# Patient Record
Sex: Male | Born: 1940 | Race: White | Hispanic: No | Marital: Single
Health system: Southern US, Community
[De-identification: ages and names within clinical notes are randomized; demographics above are authoritative.]

## PROBLEM LIST (undated history)

## (undated) DIAGNOSIS — I25810 Atherosclerosis of coronary artery bypass graft(s) without angina pectoris: Secondary | ICD-10-CM

## (undated) DIAGNOSIS — Z951 Presence of aortocoronary bypass graft: Secondary | ICD-10-CM

## (undated) DIAGNOSIS — I2510 Atherosclerotic heart disease of native coronary artery without angina pectoris: Secondary | ICD-10-CM

## (undated) HISTORY — DX: Presence of aortocoronary bypass graft: Z95.1

## (undated) HISTORY — DX: Atherosclerosis of coronary artery bypass graft(s) without angina pectoris: I25.810

## (undated) HISTORY — DX: Atherosclerotic heart disease of native coronary artery without angina pectoris: I25.10

---

## 1996-11-28 DIAGNOSIS — Z951 Presence of aortocoronary bypass graft: Secondary | ICD-10-CM

## 1996-11-28 HISTORY — PX: CORONARY ARTERY BYPASS GRAFT: SHX141

## 1996-11-28 HISTORY — DX: Presence of aortocoronary bypass graft: Z95.1

## 1999-04-23 ENCOUNTER — Encounter: Payer: Self-pay | Admitting: Emergency Medicine

## 1999-04-23 ENCOUNTER — Inpatient Hospital Stay (HOSPITAL_COMMUNITY): Admission: EM | Admit: 1999-04-23 | Discharge: 1999-04-25 | Payer: Self-pay | Admitting: Emergency Medicine

## 1999-05-10 ENCOUNTER — Ambulatory Visit (HOSPITAL_COMMUNITY): Admission: RE | Admit: 1999-05-10 | Discharge: 1999-05-10 | Payer: Self-pay | Admitting: Cardiology

## 1999-05-11 ENCOUNTER — Encounter: Payer: Self-pay | Admitting: Cardiology

## 1999-06-03 ENCOUNTER — Inpatient Hospital Stay (HOSPITAL_COMMUNITY): Admission: AD | Admit: 1999-06-03 | Discharge: 1999-06-05 | Payer: Self-pay | Admitting: Cardiology

## 1999-09-14 ENCOUNTER — Emergency Department (HOSPITAL_COMMUNITY): Admission: EM | Admit: 1999-09-14 | Discharge: 1999-09-14 | Payer: Self-pay | Admitting: Emergency Medicine

## 2002-09-19 ENCOUNTER — Ambulatory Visit (HOSPITAL_COMMUNITY): Admission: RE | Admit: 2002-09-19 | Discharge: 2002-09-19 | Payer: Self-pay | Admitting: Cardiology

## 2002-09-20 ENCOUNTER — Encounter: Payer: Self-pay | Admitting: Cardiology

## 2004-09-21 ENCOUNTER — Encounter (INDEPENDENT_AMBULATORY_CARE_PROVIDER_SITE_OTHER): Payer: Self-pay | Admitting: *Deleted

## 2004-09-21 ENCOUNTER — Ambulatory Visit (HOSPITAL_COMMUNITY): Admission: RE | Admit: 2004-09-21 | Discharge: 2004-09-21 | Payer: Self-pay | Admitting: Gastroenterology

## 2009-07-06 ENCOUNTER — Encounter: Admission: RE | Admit: 2009-07-06 | Discharge: 2009-07-06 | Payer: Self-pay | Admitting: Cardiology

## 2009-07-10 ENCOUNTER — Inpatient Hospital Stay (HOSPITAL_COMMUNITY): Admission: RE | Admit: 2009-07-10 | Discharge: 2009-07-11 | Payer: Self-pay | Admitting: Cardiology

## 2010-11-28 DIAGNOSIS — I25810 Atherosclerosis of coronary artery bypass graft(s) without angina pectoris: Secondary | ICD-10-CM

## 2010-11-28 HISTORY — DX: Atherosclerosis of coronary artery bypass graft(s) without angina pectoris: I25.810

## 2010-11-28 HISTORY — PX: CARDIAC CATHETERIZATION: SHX172

## 2011-01-18 ENCOUNTER — Ambulatory Visit
Admission: RE | Admit: 2011-01-18 | Discharge: 2011-01-18 | Disposition: A | Discharge status: Home / Self Care | Payer: BC Managed Care – PPO | Source: Ambulatory Visit | Attending: Family Medicine | Admitting: Family Medicine

## 2011-01-18 ENCOUNTER — Other Ambulatory Visit: Payer: Self-pay | Admitting: Family Medicine

## 2011-01-18 DIAGNOSIS — R079 Chest pain, unspecified: Secondary | ICD-10-CM

## 2011-01-18 DIAGNOSIS — R0781 Pleurodynia: Secondary | ICD-10-CM

## 2011-01-18 MED ORDER — IOHEXOL 300 MG/ML  SOLN
75.0000 mL | Freq: Once | INTRAMUSCULAR | Status: AC | PRN
  Administered 2011-01-18: 75 mL via INTRAVENOUS

## 2011-02-25 ENCOUNTER — Ambulatory Visit (HOSPITAL_COMMUNITY)
Admission: RE | Admit: 2011-02-25 | Discharge: 2011-02-25 | Disposition: A | Discharge status: Home / Self Care | Payer: BC Managed Care – PPO | Source: Ambulatory Visit | Attending: Cardiology | Admitting: Cardiology

## 2011-02-25 DIAGNOSIS — R9439 Abnormal result of other cardiovascular function study: Secondary | ICD-10-CM | POA: Insufficient documentation

## 2011-02-25 DIAGNOSIS — I2510 Atherosclerotic heart disease of native coronary artery without angina pectoris: Secondary | ICD-10-CM | POA: Insufficient documentation

## 2011-02-25 DIAGNOSIS — Z9861 Coronary angioplasty status: Secondary | ICD-10-CM | POA: Insufficient documentation

## 2011-02-26 NOTE — Procedures (Signed)
Jeffery Carey, Jeffery Carey NO.:  [id number]  MEDICAL RECORD NO.:  [id number]           PATIENT TYPE:  O  LOCATION:  [facility]                         FACILITY:  [facility]  PHYSICIAN:  Landry Corporal, MD DATE OF BIRTH:  25-Jun-1941  DATE OF PROCEDURE:  02/25/2011 DATE OF DISCHARGE:  02/25/2011                           CARDIAC CATHETERIZATION   PERFORMING PHYSICIAN:  Landry Corporal, MD  PRIMARY CARDIOLOGIST:  Thereasa Solo. Little, MD  PRIMARY CARE PHYSICIAN:  Anna Genre. Little, MD  PROCEDURE PERFORMED: 1. Left heart catheterization via 5-French right femoral artery     access. 2. Left ventriculogram in the right anterior oblique projection, 10 mL     contrast for 4 seconds. 3. Native coronary artery angiography. 4. Left internal mammary artery angiography. 5. Saphenous vein graft angiography x3.  INDICATION:  Abnormal stress test with known coronary disease.  Mr. Eisenhardt is a very pleasant 70 year old gentleman with history of known coronary disease status post PCI to the right coronary artery, followed by CABG x4 in 1998.  He then subsequently underwent PCI to the SVG to RCA on two separate occasions, most recently being in 2010.  He was seen by Dr. Clarene Duke at that time and he did not have any symptoms in the setting of having a severe stenosis in the vein graft and therefore, for surveillance given his risk factors, the patient was sent for nuclear myocardial perfusion study which showed evidence of new area of ischemia in the inferobasal region.  Just based on the prior disease noted with an abnormal stress test, the patient was referred for diagnostic catheterization.  The patient has not necessarily noted any anginal symptoms but did not note at the time of his previous angioplasty either despite severe disease.  The risks, benefits, alternatives, and indications of the procedure were explained to the patient in detail.  The patient voiced understanding and  agreed to proceed.  Informed consent was obtained with a signed form placed on the chart.  PROCEDURE:  The patient was brought to the Second [facility] in the fasting state.  He was prepped and draped in usual sterile fashion with right femoral access site open. After time-out period was performed, the patient was sedated with intravenous Versed.  The right femoral head was then localized using tactile and fluoroscopic guidance and the right groin was anesthetized using 1% subcutaneous lidocaine.  Right common femoral artery was then accessed using the modified Seldinger technique and a 5-French sheath was placed without difficulty.  After the sheath was aspirated and flushed, first a 5-French JL-4 followed by 5-French JR-4 catheter were advanced over wire.  Multiple angiographic views of first left coronary artery system and followed by the right coronary artery native as well as the three vein grafts and the left internal mammary artery were performed.  The order was actually vein graft to the RCA followed by native RCA, followed by the two vein grafts to the diagonal and ramus intermedius.  The catheter was then pulled back around the arch into the left subclavian artery.  With the catheter advanced  into the subclavian artery, it was then aspirated and flushed and selective angiography of the left internal mammary was performed.  The catheter was then pulled back into the descending aorta, exchanged over wire for a 5-French pigtail catheter which was advanced across the aortic valve measuring left ventricular hemodynamics.  A left ventriculogram was then performed in the RAO projection.  After completion, left ventricular pressures were then remeasured and the catheter was pulled back across the aortic valve measuring pullback gradient.  After completion of this procedure, the catheter was removed completely out of the body over wire  without complications.  The patient was stable before, during, and after the procedure and there were no complications.  Estimated blood loss was 10 mL.  CATHETERIZATION STATISTICS: 1. Sedation.  1 mg IV Versed. 2. Contrast.  Total 125 mL.  HEMODYNAMIC RESULTS: 1. Central aortic pressure 101/65 mmHg with a mean of 81 mmHg. 2. Left ventricular pressure 103/8 mmHg with an EDP of 40 mmHg. 3. The patient remained electrocardiographically and hemodynamically     stable during the entire procedure.  He did receive 250 mL bolus as     his initial opening central venous and central arterial pressures     were noted to be in the low 90s, but the patient was asymptomatic     throughout and is known to have pressures at this level.  ANGIOGRAPHIC FINDINGS: 1. The left main coronary artery is a large ectatic vessel that does     have some distal terminal 30% tubular stenoses in at least 4-mm     vessel. 2. Circumflex is a small to moderate caliber vessel and has a couple     of small obtuse marginals, one of which was 100% occluded.  As     known previously, it then courses into the atrioventricular groove     and it gives some left-to-right collaterals to a right     posterolateral branch.  There is no significant obstructive disease     in the obtuse marginal system. 3. After the diagonal/ramus intermedius is a moderate-sized vessel     that has some ostial/proximal 50-60% narrowing that was thought to     be relatively similar to the prior angiography.  At the downstream,     the vessel bifurcates and then one of the branches actually gives     off another branch and the rest of the vessel had no real     significant coronary artery disease.  There was no evidence of any     retrograde filling from a vein graft at this time. 4. The LAD.  The proximal one half of the vessel was very dilated and     ectatic up to maybe 5 mm in diameter.  It was noted at Dr. Fredirick Maudlin     catheterization.  The  distal half then tapers down and normal size.     Again, there is a small diagonal branch still present.  There is a     small nub of a graft visible in one image that appears to now be     occluded.  The diagonal branch itself is a bifurcating vessel     almost the size of the LAD but then tapers off.  It also gives some     collaterals to the left posterolateral branch.  The LAD does not     reach down to the apex. 5. Right coronary artery is a very large vessel, again may  be 5 mm in     diameter.  The entire midsegment has multiple areas of 90-99%     stenoses in a previously placed stent.  It has bridging collaterals     there as well.  The rest part of the distal RCA is diffusely     diseased going to the posterolateral vessel.  There was again noted     collateral filling of left posterolateral branch from the diagonal     artery and the left circumflex artery.  GRAFTS: 1. The left internal mammary artery to the LAD was very small ectatic     and no longer reaches to the LAD. 2. Saphenous vein graft to the right coronary artery has two widely     patent stents with may be 20% stent in-stent restenosis in the more     proximal portion of the recently placed proximal stent which was a     3.5 x 28 bare-metal stent up sized to 3.75 mm.  There are mild     luminal irregularities through the rest of this graft with     excellent TIMI-3 flow down to the posterior descending artery which     does reach down to the apex. 3. Saphenous vein graft to the ramus intermedius that was patent yet     had subtotal occlusion with 90+ percent stenoses in the previous     catheterization is now 100% flush occluded in the aorto-ostial     location as is the known occlusion of the saphenous vein graft to     the first diagonal branch. 4. Comparative analysis of the prior catheterization to this one     demonstrated that the lesion in the ramus intermedius is roughly     the same as it was during the  last catheterization and with the     main difference being the vein graft to the ramus intermedius is     now 100% occluded. 5. The left ventriculography showed ejection fraction of at least 60%     with no significant wall motion abnormalities.  IMPRESSION: 1. One out of three vein grafts patent, the other two are 100%     occluded in the aorto-ostial location.  The widely patent     percutaneous coronary intervention sites in the saphenous vein     graft to the right coronary artery. 2. Moderate but nonobstructive disease to the ramus intermedius, but     no significant changes from previous. 3. Ectatic proximal left anterior descending territory tapering to     small distal left anterior descending that does not reach the apex.     Left-to-right collaterals to the right posterior descending artery     with severe native right coronary artery disease.  RECOMMENDATIONS: 1. Continue optimized medical management at this time with no plans to     intervene on the ramus intermedius lesion.  If the patient did have     symptoms, this will potentially be the culprit to watch for in     future procedures. 2. The patient will likely be discharged home today and then follow up     with Dr. Clarene Duke.  If he has time, we will get smoking cessation     counseling. 3. I will add aspirin to his home regimen.          ______________________________ Landry Corporal, MD     DWH/MEDQ  D:  02/25/2011  T:  02/26/2011  Job:  [id number]  cc:   Thereasa Solo. Little, M.D. Anna Genre Little, M.D. [facility]  Electronically Signed by Bryan Lemma MD on 02/26/2011 07:37:49 AM

## 2011-03-06 LAB — CBC
MCHC: 34.7 g/dL (ref 30.0–36.0)
Platelets: 201 10*3/uL (ref 150–400)
RBC: 4.51 MIL/uL (ref 4.22–5.81)
RDW: 14 % (ref 11.5–15.5)

## 2011-03-06 LAB — BASIC METABOLIC PANEL
CO2: 28 mEq/L (ref 19–32)
Calcium: 9.1 mg/dL (ref 8.4–10.5)
GFR calc Af Amer: >60 mL/min (ref >60)
GFR calc non Af Amer: >60 mL/min (ref >60)
Sodium: 138 mEq/L (ref 135–145)

## 2011-03-06 LAB — CARDIAC PANEL(CRET KIN+CKTOT+MB+TROPI)
CK, MB: 8.9 ng/mL — ABNORMAL HIGH (ref 0.3–4.0)
Relative Index: 5.1 — ABNORMAL HIGH (ref 0.0–2.5)
Total CK: 181 U/L (ref 7–232)
Total CK: 195 U/L (ref 7–232)
Troponin I: 0.83 ng/mL (ref 0.00–0.06)

## 2011-04-12 NOTE — Cardiovascular Report (Signed)
NAMEBRAYN, Carey                  ACCOUNT NO.:  [id number]   MEDICAL RECORD NO.:  [id number]          PATIENT TYPE:  INP   LOCATION:  2504                         FACILITY:  [facility]   PHYSICIAN:  Thereasa Solo. Little, M.D. DATE OF BIRTH:  14-Nov-1941   DATE OF PROCEDURE:  07/10/2009  DATE OF DISCHARGE:                            CARDIAC CATHETERIZATION   INDICATIONS FOR TEST:  This 70 year old male had bypass surgery in 1998.  He subsequently had a stent placed in the saphenous vein graft to the  RCA in 2002.  He is asymptomatic but had a screening Cardiolite study  that was positive for inferior apical ischemia.  Because of this, he is  brought to the cath lab for outpatient cardiac catheterization.   After obtaining informed consent, the patient was prepped and draped in  the usual sterile fashion exposing the right groin.  Following local  anesthetic with 1% Xylocaine, the Seldinger technique was employed and a  5-French introducer sheath was placed in the right femoral artery on the  first stick.  Left and right coronary arteriography, graft  visualization, ventriculography, and a distal aortogram were performed.   An internal mammary artery catheter was used to cannulate the IMA and a  right coronary catheter cannulated the other grafts.  There was hang up  of the J-wire at the bifurcation and as a result of this, a distal  aortogram was performed.   RESULTS:  1. Hemodynamic monitoring:  Central aortic pressure was 120/78.  Left      ventricular pressure was 125/7.  There was no significant gradient      noted at the time of pullback.  2. Ventriculography:  Ventriculography was performed in the RAO      projection at the end of the diagnostic procedure.  The systolic      function was normal.  The end-diastolic pressure was 17.  No mitral      regurgitation was seen.  The ejection fraction was 55%.  3. 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  Doppler      studies will need to be obtained to evaluate the size of this.  It      did not extend into the iliacs.  4. Coronary arteriography:      a.     Left main:  There was terminal 30% left main disease.  The       left main was about 3.5 mm in diameter.      b.     Circumflex:  The circumflex was a small vessel with very       small OMs with no high-grade stenosis in this system.      c.     Optional diagonal:  There was ostial 50-60% narrowing of       this vessel.  The vessel actually bifurcated and was free of       disease in  other areas.  There was some retrograde filling of the       saphenous vein graft to this system.      d.     LAD:  The proximal half of the LAD was dilated and ectatic       and about 5 mm in diameter.  The distal half was of normal size       and gave rise to a small diagonal.  This segment was free of       disease.      e.     Right coronary artery:  The right coronary artery was a huge       vessel.  It was at least 5 mm in diameter.  The entire mid segment       which was about 25 mm in length had areas of 90% plus narrowing       with remainder of the mid and distal RCA diffusely diseased.       There was collateral filling of the PDA from collaterals from the       left.   Grafts:  1. Saphenous vein graft to the RCA.  In the midportion of the graft      just above the previously placed stent was an area of sequential      80% narrowing with an aneurysmal segment between the two.  The      ongoing graft had mild irregularities and the PDA was widely      patent.  2. Saphenous vein graft to the optional diagonal.  This graft was      subtotaled proximally with faint visualization of the optional      diagonal system secondary to competitive flow from the native      circulation.  3. Saphenous vein graft to the diagonal  100% occluded.  4. Internal mammary artery to the LAD basically ectatic and      nonfunctioning.   Because of the high-grade stenosis in the vein graft to the right and  corresponding ischemia on the nuclear study, arrangements were made for  intervention.  This ended up being a complex PCI.  A 6-French introducer  sheath was exchanged for the 5-French and a JR-4 guide catheter was  used.  A short Luge wire was easily placed down the graft into the  distal RCA.  Attempts at primary stenting were unsuccessful.  The stent  would not pass through the area of obstruction.   Predilatation was accomplished with a 2.5 x 10 Voyager.  A single  inflation of 12 atmospheres for 39 seconds resulted in improvement in  the area of narrowing, but the stent still would not pass.   Predilatation was then performed with the 2.5 x 10 cutting balloon.  Two  Inflations 10 x 45 and 12 x 40 were performed, still the stent would not  pass.   A buddy wire technique was used with a BMW wire.  With this in place,  the stent was finally able to be managed into position that it well  overlapped the original stent and extended proximally well past the area  of obstruction.  Stent deployment of the 3.5 x 28-mm Vision stent was 11  atmospheres for 47 seconds with a final inflation of 12 atmospheres for  44 seconds.   There was no flow phenomenon with this.  His ST segments had been  intermittently elevated, and he required intracoronary nitroglycerin.  After the stent was placed,  there was diminished flow down the distal  vessel and even through the stent.  I postdilated this area with a 3.75  x 20-mm Voyager balloon with 2 inflations.  Following this, he received  additional intracoronary nitroglycerin and 200 mcg of IV verapamil.  With this, there was return of brisk distal flow.  The chest pain  resolved.  The ST-segment that were transiently elevated return to  baseline.   The area that had been 80%  narrowed pre-intervention, now appeared to be  normal post-intervention.  There was good brisk distal flow.   I started him on IV Integrilin for 12 hours just to make sure that any  distal debris had an opportunity to resolve should it be thrombus.   I anticipate his CK and troponins to be elevated.  He will probably need  to be watched on Saturday rather going home as originally planned.   There is still an area of concern that affects the optional diagonal  system.  The graft is such so diffusely diseased that I do not think I  can safely intervene on this.  In addition to that, he has excellent  bidirectional flow and the only concern would be the ostium of the  optional diagonal, it was only 50-60% narrowed and the only safe  treatment  would be a cutting balloon, but at this point with the patient  asymptomatic and the nuclear study not showing any evidence of ischemia  in the distribution of this, I have opted to continue to treat him  medically.   I used a total of 280 mL of contrast and as a result of this, I will  hydrate him aggressively over the next 12 hours.           ______________________________  Thereasa Solo Little, M.D.     ABL/MEDQ  D:  07/10/2009  T:  07/10/2009  Job:  [id number]   cc:   Catheterization Lab  Vikki Ports, M.D.

## 2011-04-12 NOTE — Discharge Summary (Signed)
Jeffery Carey, Jeffery Carey                  ACCOUNT NO.:  [id number]   MEDICAL RECORD NO.:  [id number]          PATIENT TYPE:  INP   LOCATION:  2504                         FACILITY:  [facility]   PHYSICIAN:  Thereasa Solo. Little, M.D. DATE OF BIRTH:  Apr 24, 1941   DATE OF ADMISSION:  07/10/2009  DATE OF DISCHARGE:  07/11/2009                               DISCHARGE SUMMARY   DISCHARGE DIAGNOSES:  1. Coronary artery disease with bypass grafting in 1998, and history      of stent to saphenous vein graft to the right coronary artery in      2000, now with new abnormal nuclear stress test.  2. New finding of stenosis in the saphenous vein graft to the right      coronary artery just proximal to the stent from 2002, undergoing      percutaneous transluminal coronary angioplasty and stent deployment      with a Multilink Vision stent.  There was a complex procedure as      well.  The patient has 100% stenosis of saphenous vein graft to the      diagonal.  3. Obstructive sleep apnea with continuous positive airway pressure.  4. Situational depression with recent loss of his wife due to lung      cancer.  5. Positive tobacco history.  6. Hyperlipidemia.  7. Hypertension.   DISCHARGE CONDITION:  Improved.   PROCEDURES:  Combined left heart catheterization, July 10, 2009, by  Dr. Clarene Duke.   On July 10, 2009, percutaneous transluminal coronary angioplasty and  stent deployment with a Multilink Vision stent to the saphenous vein  graft to the right coronary artery just proximal to the stent placement  in 2002.   DISCHARGE MEDICATIONS:  1. Niaspan 500 mg 3 daily.  2. Potassium 20 mEq daily.  3. Aspirin 325 daily.  4. Lasix 40 mg daily.  5. Vytorin 10/30 daily.  6. Lexapro 20 mg daily.  7. Plavix 75 mg 1 daily, do not stop.  8. Pepcid 20 mg over-the-counter daily to protect his stomach.  9. Toprol-XL 25 mg daily.  10.Lisinopril 5 mg take half a tablet daily.  11.Nitroglycerin 1/150 under your  tongue as needed while sitting one      every 5 minutes up to 3 over 15 minutes, if pain continues, call      [phone].   Please note, these were called into the [facility] pharmacy at [facility]  at the patient's request.   DISCHARGE INSTRUCTIONS:  1. Increase activity slowly.  May shower.  No lifting for 1 week.  No      driving for 2 days.  No sexual activity for 2 days.  2. Low-sodium, heart-healthy diet.  3. Continue CPAP at bedtime.  4. Wash cath site with soap and water.  Call if any bleeding,      swelling, or drainage.  5. Stop smoking.  6. Follow up with Dr. Clarene Duke in 2 weeks.  The office will call with      date and time.   HOSPITAL COURSE:  The patient was brought in  for cardiac catheterization  after a positive nuclear study revealing inferior apical ischemia, which  was new from previous study.  EF on the nuclear study was 51%.  He has  history of coronary artery disease with bypass grafting and a stent  placed to the saphenous vein graft to the RCA in 2002 or 2000.  He also  has obstructive sleep apnea, hyperlipidemia, and hypertension.  He has  had a lot of depression and on Lexapro secondary to his wife's recent  death.  He has saphenous vein graft stenosis proximal to the previous  stents, underwent PTCA and stent deployment by Dr. Clarene Duke.  The patient  tolerated procedure well.  His enzymes initially elevated, but trended  down pretty quickly.  By the next morning, Dr. Mariah Milling felt he was stable  and ready for discharge home.   LABORATORY VALUES:  Initial postprocedure CK-MB was 195; MB at 10;  troponin 1.09, second one was 181 with MB of 8.9 and troponin 0.83.   Sodium 138, potassium 4.2, BUN 11, creatinine 0.85, and glucose 107.   Hemoglobin 15.4, hematocrit 44.3, platelets 201, and WBC 7.4.   PHYSICAL EXAMINATION AT DISCHARGE:  VITAL SIGNS:  Blood pressure 131/81,  pulse 74, respiratory rate 18, temp 97.4, and oxygen saturation on room  air 94%.  HEART:   Regular rate and rhythm.  LUNGS:  Clear to auscultation bilaterally.  ABDOMEN:  Soft and nontender.  EXTREMITIES:  No lower extremity edema.  SKIN:  Warm and dry.  NEUROLOGIC:  Alert and oriented.   The patient was discharged home after ambulating in the hallway without  complications with cardiac rehab who also reviewed on tobacco cessation  as well as proper diet.   FOLLOWUP:  The patient will follow up with Dr. Clarene Duke.      Jeffery Carey, N.P.    ______________________________  Thereasa Solo Little, M.D.    LRI/MEDQ  D:  07/11/2009  T:  07/11/2009  Job:  [id number]   cc:   Thereasa Solo. Little, M.D.  Vikki Ports, M.D.

## 2011-06-03 ENCOUNTER — Other Ambulatory Visit: Payer: Self-pay | Admitting: Family Medicine

## 2011-06-03 DIAGNOSIS — R911 Solitary pulmonary nodule: Secondary | ICD-10-CM

## 2011-07-25 ENCOUNTER — Ambulatory Visit
Admission: RE | Admit: 2011-07-25 | Discharge: 2011-07-25 | Disposition: A | Discharge status: Home / Self Care | Payer: Medicare Other | Source: Ambulatory Visit | Attending: Family Medicine | Admitting: Family Medicine

## 2011-07-25 DIAGNOSIS — R911 Solitary pulmonary nodule: Secondary | ICD-10-CM

## 2011-07-25 MED ORDER — IOHEXOL 300 MG/ML  SOLN
100.0000 mL | Freq: Once | INTRAMUSCULAR | Status: AC | PRN
  Administered 2011-07-25: 100 mL via INTRAVENOUS

## 2011-08-02 ENCOUNTER — Other Ambulatory Visit: Payer: Self-pay | Admitting: Dermatology

## 2012-02-02 ENCOUNTER — Other Ambulatory Visit: Payer: Self-pay | Admitting: Family Medicine

## 2012-02-02 DIAGNOSIS — R918 Other nonspecific abnormal finding of lung field: Secondary | ICD-10-CM

## 2012-02-03 ENCOUNTER — Other Ambulatory Visit: Payer: Self-pay | Admitting: Otolaryngology

## 2012-02-23 ENCOUNTER — Ambulatory Visit
Admission: RE | Admit: 2012-02-23 | Discharge: 2012-02-23 | Disposition: A | Discharge status: Home / Self Care | Payer: Medicare Other | Source: Ambulatory Visit | Attending: Family Medicine | Admitting: Family Medicine

## 2012-02-23 DIAGNOSIS — R918 Other nonspecific abnormal finding of lung field: Secondary | ICD-10-CM

## 2013-03-05 IMAGING — CT CT CHEST W/O CM
2 of 4 series · 15 of 36 positions shown, 18 images · non-contrast
Comparison: 07/25/2011

CLINICAL DATA: Follow up of pulmonary nodules.  Diagnosed
07/19/2011.  Occasional cigar smoker.  No history of primary
malignancy.

CT CHEST WITHOUT CONTRAST
TECHNIQUE: Multidetector CT imaging of the chest was performed
following the standard protocol without IV contrast.

[Series 2: chest w/o · axial · non-contrast · 0.76mm/px · z∈[-305,-30]mm · 12 of 66 slices shown, 15 images]
[im 6/66  mediastinal]
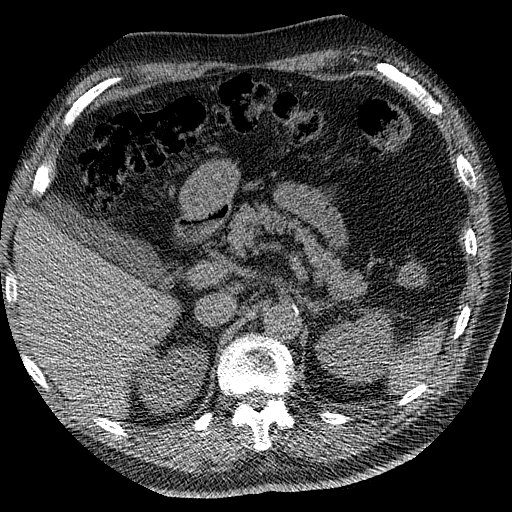
[im 6/66  lung]
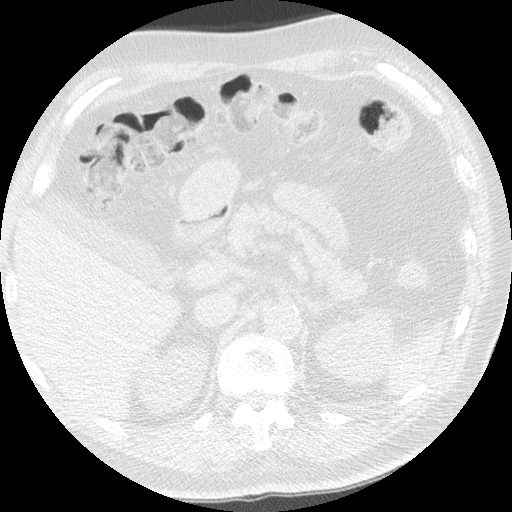
[im 11/66  lung]
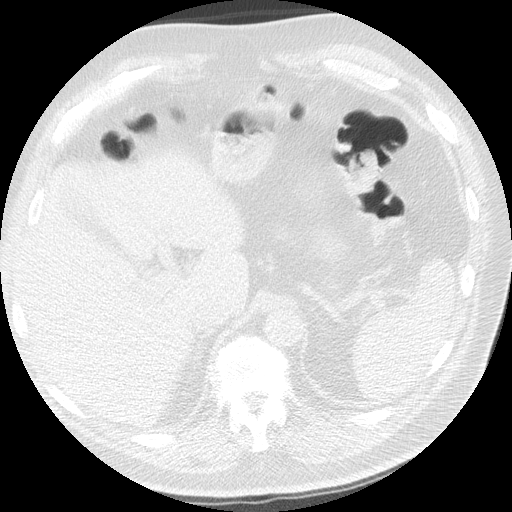
[im 16/66  lung]
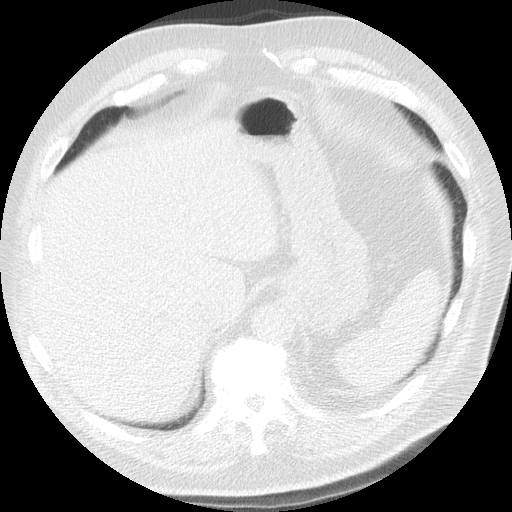
[im 21/66  lung]
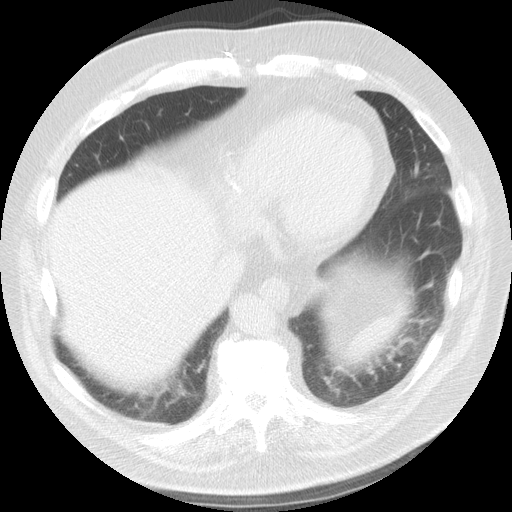
[im 26/66  mediastinal]
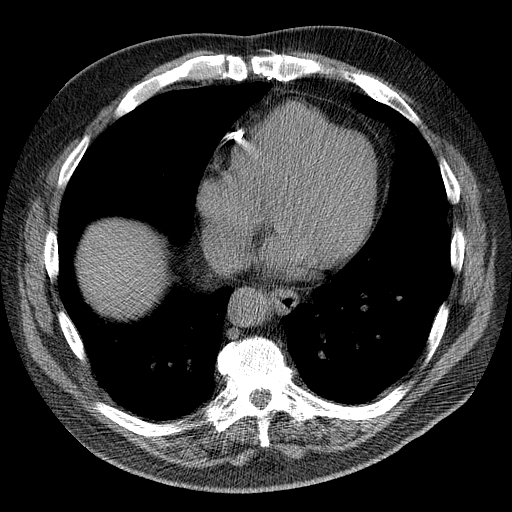
[im 26/66  lung]
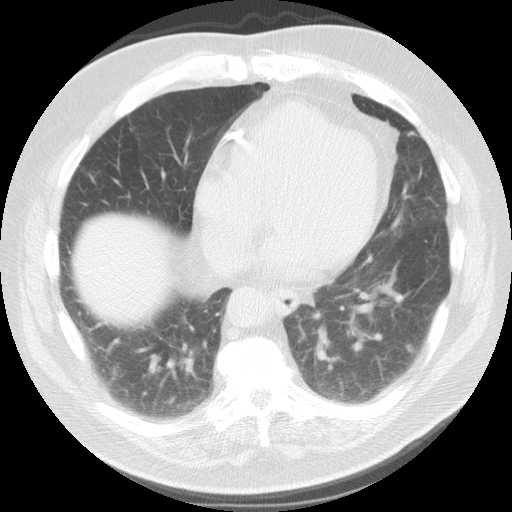
[im 31/66  lung]
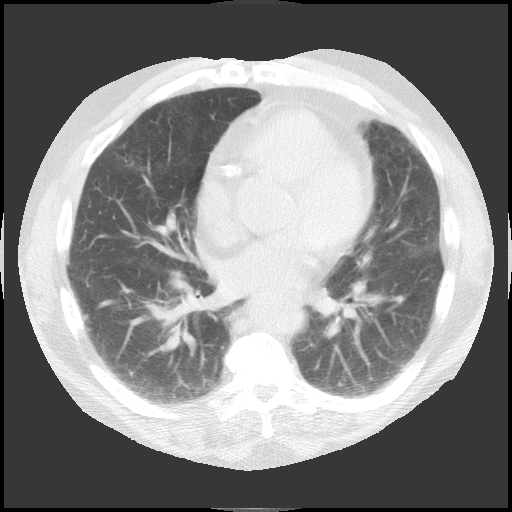
[im 36/66  lung]
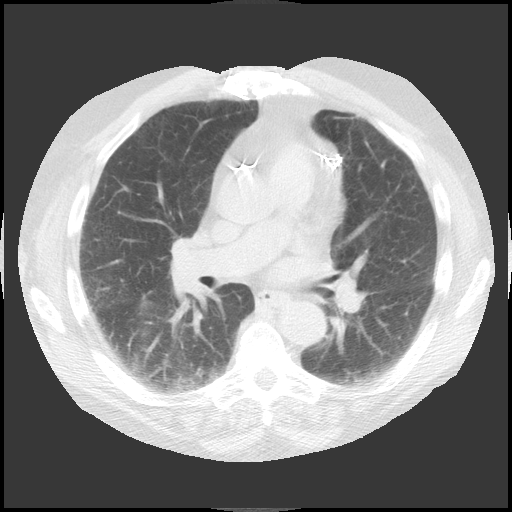
[im 41/66  lung]
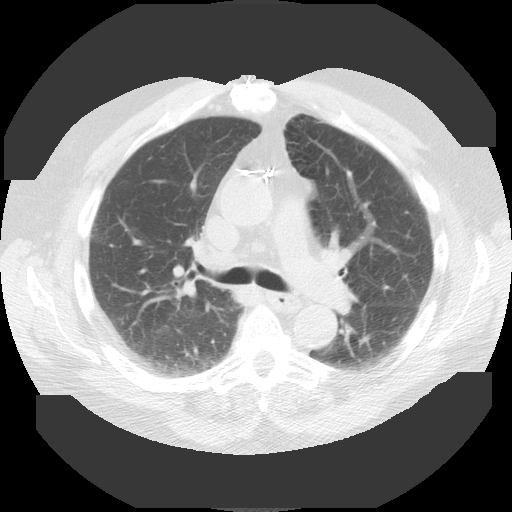
[im 46/66  mediastinal]
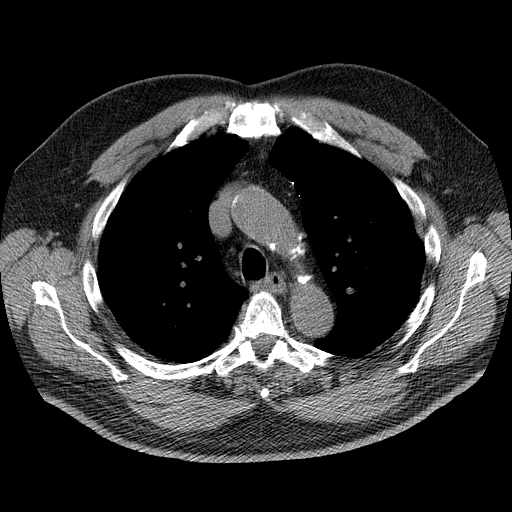
[im 46/66  lung]
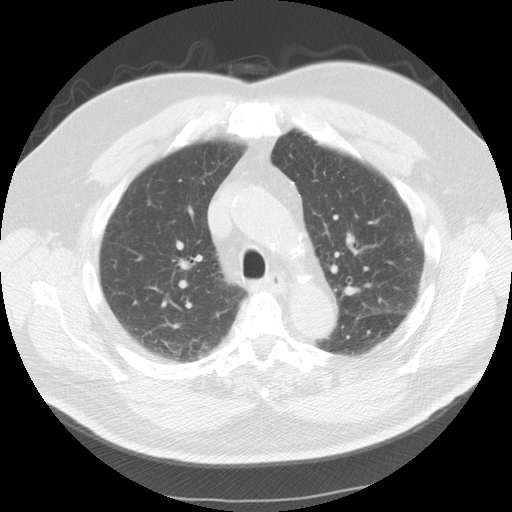
[im 51/66  lung]
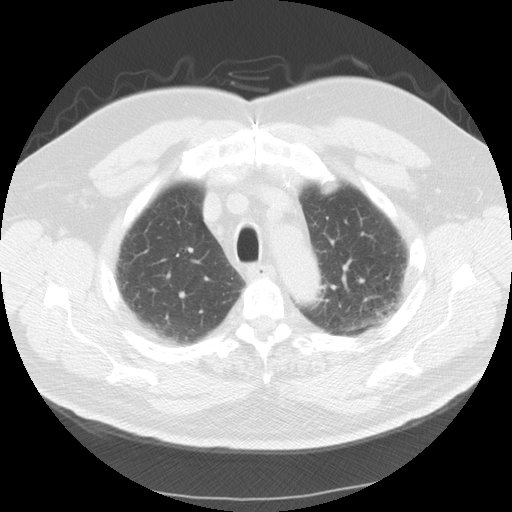
[im 56/66  lung]
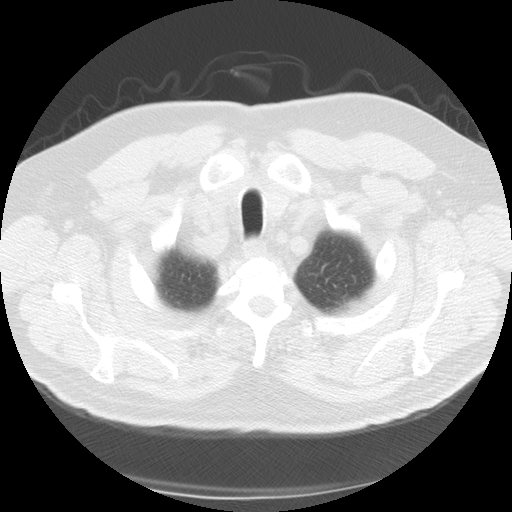
[im 61/66  lung]
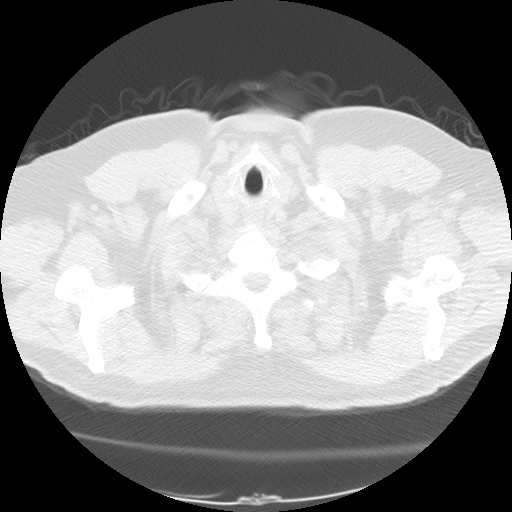

[Series 103: cor · coronal · 0.76mm/px · 3 of 141 slices shown]
[im 29/141  lung]
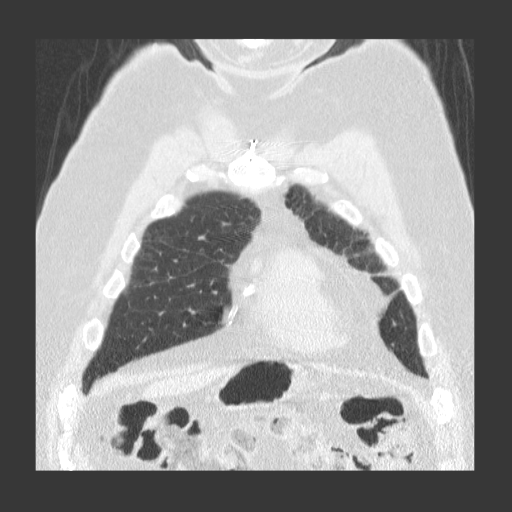
[im 57/141  lung]
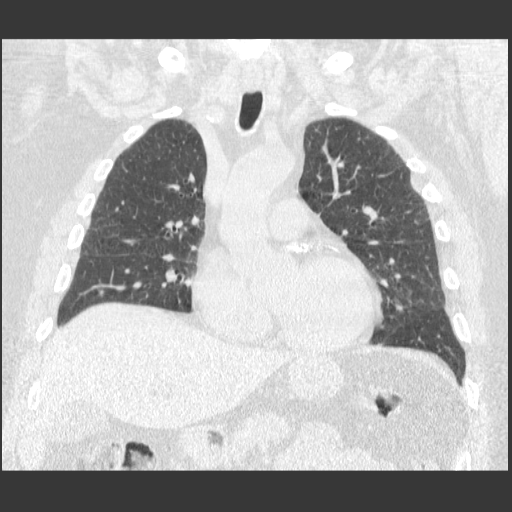
[im 85/141  lung]
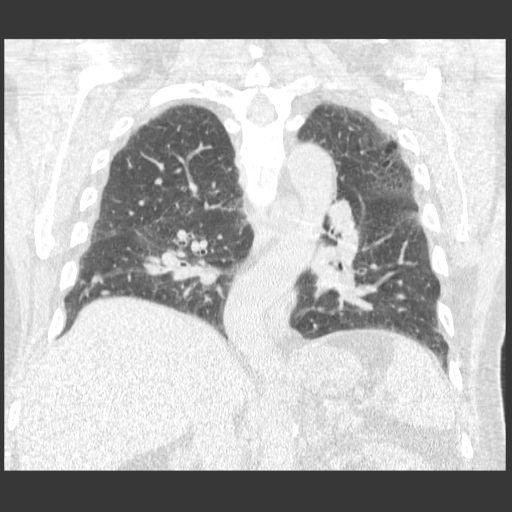

[15 of 36 positions shown; findings below may reference images not displayed]

FINDINGS: Lung windows demonstrate mild centrilobular emphysema.

- 5 mm right upper lobe lung nodule on image 24 is similar to
decreased since the prior, where it measured 6 mm.
- Minimal subpleural nodularity at the right lower lobe on image
36.
- Mild nodularity along the left major fissure on image 37 is
unchanged.
- Mild bibasilar dependent atelectasis.
- 6 mm left lower lobe lung nodule is unchanged on image 41.

No new or enlarging nodules identified.

Soft tissue windows demonstrate tortuous descending thoracic aorta.
Mild cardiomegaly with prior median sternotomy. No pericardial or
pleural effusion.  No mediastinal or definite hilar adenopathy,
given limitations of unenhanced CT.

Limited abdominal imaging demonstrates no significant findings. No
acute osseous abnormality.
IMPRESSION: Stable bilateral lung nodules since December 2010. Likely
subpleural lymph nodes.  Recommend follow-up in December 2012 to
confirm 2 years of stability; this would confirm benignity..

## 2013-06-19 ENCOUNTER — Telehealth: Payer: Self-pay | Admitting: Cardiology

## 2013-06-19 MED ORDER — POTASSIUM CHLORIDE CRYS ER 20 MEQ PO TBCR: 20.0000 meq | EXTENDED_RELEASE_TABLET | Freq: Every day | ORAL | Status: AC

## 2013-06-19 NOTE — Telephone Encounter (Signed)
Refill(s) sent to pharmacy.  Message for pt to call the office.  Home number disconnected.  Refill Error received that appears to be pt and unable to reach him at either number.

## 2013-06-19 NOTE — Telephone Encounter (Signed)
Need refill on Klor-Con 20mg  #90

## 2013-06-21 ENCOUNTER — Telehealth: Payer: Self-pay | Admitting: Cardiology

## 2013-06-21 NOTE — Telephone Encounter (Signed)
Spoke to patient . Informed him that it looks as if RX was completed

## 2013-06-21 NOTE — Telephone Encounter (Signed)
Returning call. Thanks!  

## 2013-07-10 ENCOUNTER — Other Ambulatory Visit: Payer: Self-pay | Admitting: *Deleted

## 2013-07-10 MED ORDER — FUROSEMIDE 40 MG PO TABS: 40.0000 mg | ORAL_TABLET | Freq: Every day | ORAL | Status: DC

## 2013-07-17 ENCOUNTER — Other Ambulatory Visit: Payer: Self-pay | Admitting: *Deleted

## 2013-07-17 MED ORDER — EZETIMIBE-SIMVASTATIN 10-40 MG PO TABS: 1.0000 | ORAL_TABLET | Freq: Every day | ORAL | Status: DC

## 2013-07-17 NOTE — Telephone Encounter (Signed)
Rx was sent to pharmacy electronically. 

## 2013-08-23 ENCOUNTER — Other Ambulatory Visit: Payer: Self-pay | Admitting: *Deleted

## 2013-08-23 MED ORDER — LISINOPRIL 5 MG PO TABS: 2.5000 mg | ORAL_TABLET | Freq: Every day | ORAL | Status: AC

## 2013-08-26 ENCOUNTER — Other Ambulatory Visit: Payer: Self-pay | Admitting: *Deleted

## 2013-08-29 ENCOUNTER — Other Ambulatory Visit: Payer: Self-pay | Admitting: *Deleted

## 2013-11-15 ENCOUNTER — Other Ambulatory Visit: Payer: Self-pay | Admitting: *Deleted

## 2013-11-15 NOTE — Telephone Encounter (Signed)
Medication refill refused - to defer to PCP

## 2013-11-18 ENCOUNTER — Other Ambulatory Visit: Payer: Self-pay | Admitting: *Deleted

## 2013-11-18 NOTE — Telephone Encounter (Signed)
Refill for ibuprofen 800mg  refused - defer to PCP

## 2013-11-19 ENCOUNTER — Telehealth: Payer: Self-pay | Admitting: Cardiology

## 2013-11-19 NOTE — Telephone Encounter (Signed)
Spoke to pharmacy . Declining refill.  At last visit ,patient moved to [facility]- contact PCP or cardiologist  There.

## 2013-11-19 NOTE — Telephone Encounter (Signed)
Need refill on Motrin 800 mg #90 Dr Clarene Duke name was on the prescription.

## 2014-01-22 ENCOUNTER — Other Ambulatory Visit: Payer: Self-pay | Admitting: Cardiology

## 2014-01-24 NOTE — Telephone Encounter (Signed)
Rx was sent to pharmacy electronically. 

## 2014-02-28 ENCOUNTER — Other Ambulatory Visit: Payer: Self-pay | Admitting: *Deleted

## 2014-02-28 NOTE — Telephone Encounter (Signed)
Rx refill denied. Pt moved to [place] and needs Rx filled by new Cardiologist.

## 2014-03-03 ENCOUNTER — Other Ambulatory Visit: Payer: Self-pay

## 2014-03-03 NOTE — Telephone Encounter (Signed)
Patient has moved to [place]. Received refill request for K+. Request denied. Patient should request from new cardiololgist.

## 2014-03-04 ENCOUNTER — Telehealth: Payer: Self-pay | Admitting: Cardiology

## 2014-03-04 NOTE — Telephone Encounter (Signed)
Returned call.  Left message that refill requests have been sent back w/ denials r/t pt relocating and not being followed in our office, advising they contact PCP or new cardiologist.  Also to call back before 4pm if assistance still needed.

## 2014-03-04 NOTE — Telephone Encounter (Signed)
Need a refill on Klor-Con M20 #90

## 2014-04-17 ENCOUNTER — Other Ambulatory Visit: Payer: Self-pay | Admitting: *Deleted

## 2014-05-02 ENCOUNTER — Other Ambulatory Visit: Payer: Self-pay | Admitting: *Deleted

## 2014-05-02 MED ORDER — FUROSEMIDE 40 MG PO TABS: 40.0000 mg | ORAL_TABLET | Freq: Every day | ORAL | Status: DC

## 2014-10-27 ENCOUNTER — Other Ambulatory Visit: Payer: Self-pay | Admitting: Cardiology

## 2014-11-10 ENCOUNTER — Other Ambulatory Visit: Payer: Self-pay | Admitting: Cardiology

## 2014-11-18 NOTE — Telephone Encounter (Signed)
Rx refill denied to patient pharmacy   

## 2015-06-22 ENCOUNTER — Encounter: Payer: Self-pay | Admitting: Cardiology

## 2015-07-10 ENCOUNTER — Other Ambulatory Visit: Payer: Self-pay | Admitting: Cardiology

## 2015-07-10 NOTE — Telephone Encounter (Signed)
REFILL 

## 2016-10-28 DEATH — deceased
